# Patient Record
Sex: Female | Born: 1995 | Race: White | Hispanic: No | Marital: Single | Smoking: Current every day smoker
Health system: Southern US, Community
[De-identification: ages and names within clinical notes are randomized; demographics above are authoritative.]

## PROBLEM LIST (undated history)

## (undated) ENCOUNTER — Inpatient Hospital Stay (HOSPITAL_COMMUNITY)
Admission: RE | Payer: Medicaid Other | Source: Ambulatory Visit | Attending: Certified Nurse Midwife | Admitting: Certified Nurse Midwife

## (undated) ENCOUNTER — Emergency Department (HOSPITAL_COMMUNITY): Discharge status: Absent without leave | Payer: Medicaid Other

## (undated) DIAGNOSIS — F419 Anxiety disorder, unspecified: Secondary | ICD-10-CM

## (undated) DIAGNOSIS — F329 Major depressive disorder, single episode, unspecified: Secondary | ICD-10-CM

## (undated) DIAGNOSIS — F4310 Post-traumatic stress disorder, unspecified: Secondary | ICD-10-CM

## (undated) DIAGNOSIS — F32A Depression, unspecified: Secondary | ICD-10-CM

## (undated) HISTORY — DX: Major depressive disorder, single episode, unspecified: F32.9

## (undated) HISTORY — DX: Depression, unspecified: F32.A

## (undated) HISTORY — PX: NO PAST SURGERIES: SHX2092

---
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
## 2016-04-20 IMAGING — CR DG CHEST 1V PORT
1 series · 1 of 1 positions shown · non-contrast
Comparison: None.

CLINICAL DATA: Unresponsive.  Seizure.

EXAM:
PORTABLE CHEST - 1 VIEW

[AP]
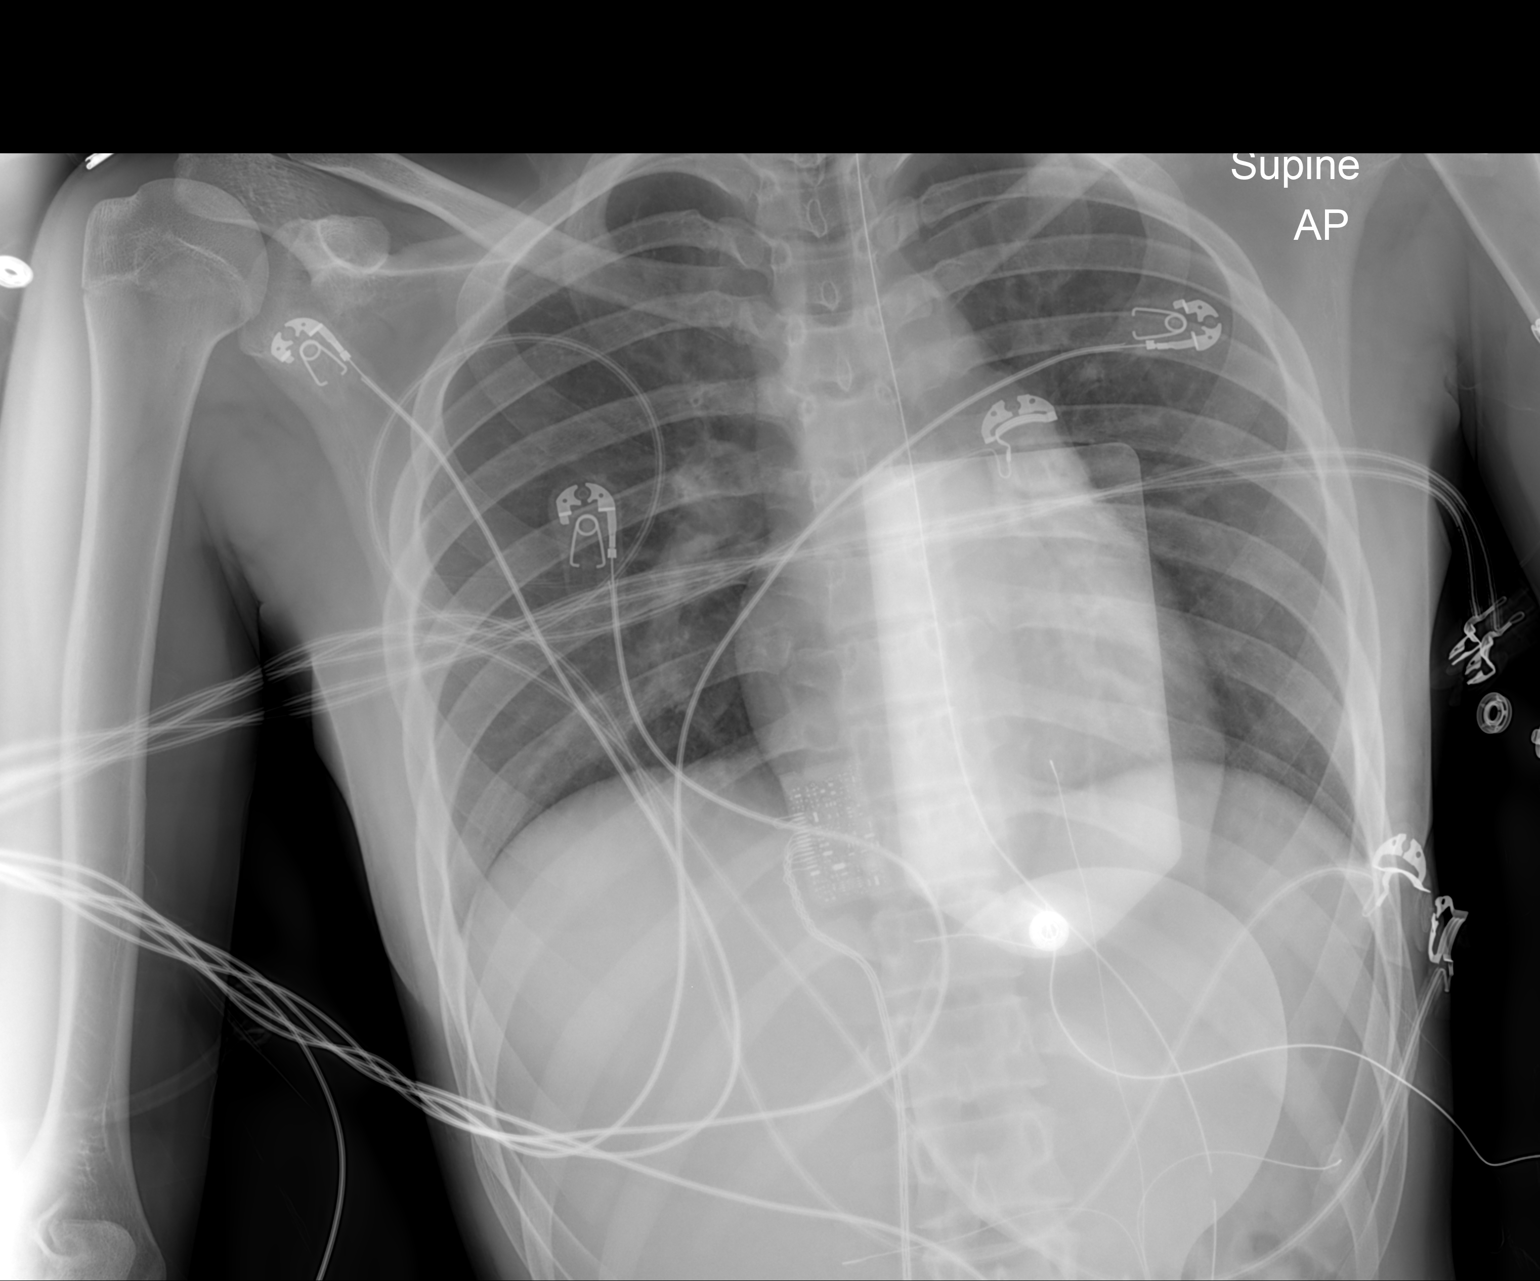

[1 of 1 positions shown; findings below may reference images not displayed]

FINDINGS: Endotracheal tube has tip 6.2 cm above the carina. Lungs are
hypoinflated and otherwise clear. Cardiomediastinal silhouette is
within normal. Bones soft tissues are within normal.
IMPRESSION: No active disease.
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
## 2019-02-26 ENCOUNTER — Encounter (HOSPITAL_COMMUNITY): Payer: Self-pay | Admitting: Emergency Medicine

## 2019-02-26 ENCOUNTER — Inpatient Hospital Stay (HOSPITAL_COMMUNITY)
Admission: EM | Admit: 2019-02-26 | Discharge: 2019-02-26 | Disposition: A | Discharge status: Home / Self Care | Payer: Medicaid Other | Attending: Obstetrics and Gynecology | Admitting: Obstetrics and Gynecology

## 2019-02-26 ENCOUNTER — Other Ambulatory Visit: Payer: Self-pay

## 2019-02-26 ENCOUNTER — Encounter: Payer: Self-pay | Admitting: Student

## 2019-02-26 DIAGNOSIS — O210 Mild hyperemesis gravidarum: Secondary | ICD-10-CM | POA: Diagnosis not present

## 2019-02-26 DIAGNOSIS — Z3A01 Less than 8 weeks gestation of pregnancy: Secondary | ICD-10-CM | POA: Diagnosis not present

## 2019-02-26 DIAGNOSIS — Z3490 Encounter for supervision of normal pregnancy, unspecified, unspecified trimester: Secondary | ICD-10-CM

## 2019-02-26 DIAGNOSIS — O99331 Smoking (tobacco) complicating pregnancy, first trimester: Secondary | ICD-10-CM | POA: Diagnosis not present

## 2019-02-26 DIAGNOSIS — F17210 Nicotine dependence, cigarettes, uncomplicated: Secondary | ICD-10-CM | POA: Insufficient documentation

## 2019-02-26 DIAGNOSIS — F1290 Cannabis use, unspecified, uncomplicated: Secondary | ICD-10-CM | POA: Insufficient documentation

## 2019-02-26 DIAGNOSIS — R109 Unspecified abdominal pain: Secondary | ICD-10-CM | POA: Diagnosis present

## 2019-02-26 HISTORY — DX: Post-traumatic stress disorder, unspecified: F43.10

## 2019-02-26 HISTORY — DX: Anxiety disorder, unspecified: F41.9

## 2019-02-26 LAB — URINALYSIS, ROUTINE W REFLEX MICROSCOPIC
Bilirubin Urine: NEGATIVE
Glucose, UA: NEGATIVE mg/dL
Hgb urine dipstick: NEGATIVE
Ketones, ur: NEGATIVE mg/dL
Leukocytes,Ua: NEGATIVE
Nitrite: NEGATIVE
Protein, ur: NEGATIVE mg/dL
Specific Gravity, Urine: 1.013 (ref 1.005–1.030)
pH: 5 (ref 5.0–8.0)

## 2019-02-26 LAB — CBC
HCT: 42.8 % (ref 36.0–46.0)
Hemoglobin: 14 g/dL (ref 12.0–15.0)
MCH: 29.5 pg (ref 26.0–34.0)
MCHC: 32.7 g/dL (ref 30.0–36.0)
MCV: 90.1 fL (ref 80.0–100.0)
Platelets: 293 10*3/uL (ref 150–400)
RBC: 4.75 MIL/uL (ref 3.87–5.11)
RDW: 12.6 % (ref 11.5–15.5)
WBC: 11.7 10*3/uL — ABNORMAL HIGH (ref 4.0–10.5)
nRBC: 0 % (ref 0.0–0.2)

## 2019-02-26 LAB — I-STAT BETA HCG BLOOD, ED (MC, WL, AP ONLY): I-stat hCG, quantitative: >2000 m[IU]/mL — ABNORMAL HIGH (ref <5)

## 2019-02-26 LAB — COMPREHENSIVE METABOLIC PANEL
ALT: 15 U/L (ref 0–44)
AST: 17 U/L (ref 15–41)
Albumin: 4.2 g/dL (ref 3.5–5.0)
Alkaline Phosphatase: 66 U/L (ref 38–126)
Anion gap: 8 (ref 5–15)
BUN: 17 mg/dL (ref 6–20)
CO2: 25 mmol/L (ref 22–32)
Calcium: 9.3 mg/dL (ref 8.9–10.3)
Chloride: 99 mmol/L (ref 98–111)
Creatinine, Ser: 0.71 mg/dL (ref 0.44–1.00)
GFR calc Af Amer: >60 mL/min (ref >60)
GFR calc non Af Amer: >60 mL/min (ref >60)
Glucose, Bld: 92 mg/dL (ref 70–99)
Potassium: 4 mmol/L (ref 3.5–5.1)
Sodium: 132 mmol/L — ABNORMAL LOW (ref 135–145)
Total Bilirubin: 0.5 mg/dL (ref 0.3–1.2)
Total Protein: 7.5 g/dL (ref 6.5–8.1)

## 2019-02-26 LAB — LIPASE, BLOOD: Lipase: 38 U/L (ref 11–51)

## 2019-02-26 LAB — RAPID URINE DRUG SCREEN, HOSP PERFORMED
Amphetamines: NOT DETECTED
Barbiturates: NOT DETECTED
Benzodiazepines: NOT DETECTED
Cocaine: NOT DETECTED
Opiates: NOT DETECTED
Tetrahydrocannabinol: POSITIVE — AB

## 2019-02-26 MED ORDER — PRENATAL ADULT GUMMY/DHA/FA 0.4-25 MG PO CHEW: 1.0000 | CHEWABLE_TABLET | Freq: Every day | ORAL | 1 refills | Status: AC

## 2019-02-26 MED ORDER — DOCUSATE SODIUM 100 MG PO CAPS: 100.0000 mg | ORAL_CAPSULE | Freq: Two times a day (BID) | ORAL | 0 refills | Status: AC

## 2019-02-26 MED ORDER — ONDANSETRON 4 MG PO TBDP
8.0000 mg | ORAL_TABLET | Freq: Once | ORAL | Status: AC
  Administered 2019-02-26: 8 mg via ORAL
  Filled 2019-02-26: qty 2

## 2019-02-26 MED ORDER — ONDANSETRON 8 MG PO TBDP: 8.0000 mg | ORAL_TABLET | Freq: Three times a day (TID) | ORAL | 0 refills | Status: AC | PRN

## 2019-02-26 MED ORDER — SODIUM CHLORIDE 0.9% FLUSH: 3.0000 mL | Freq: Once | INTRAVENOUS | Status: DC

## 2019-02-26 NOTE — MAU Note (Signed)
Sent up from ED, +preg test in  ED

## 2019-02-26 NOTE — MAU Note (Signed)
Pt states she began having n/v yesterday morning.  Pt states she has vomited twice in last 24hrs.  Pt states she has not taken any meds for n/v.  Denies pain or VB.

## 2019-02-26 NOTE — Discharge Instructions (Signed)
-Please call [facility] at  [phone] to schedule a new ob visit.  -Right now we assume you are about [redacted] weeks pregnant, and we don't really start prenatal care until around [redacted] weeks pregnant.  -Someone will call you for an ultrasound in two weeks.     Morning Sickness  Morning sickness is when a woman feels nauseous during pregnancy. This nauseous feeling may or may not come with vomiting. It often occurs in the morning, but it can be a problem at any time of day. Morning sickness is most common during the first trimester. In some cases, it may continue throughout pregnancy. Although morning sickness is unpleasant, it is usually harmless unless the woman develops severe and continual vomiting (hyperemesis gravidarum), a condition that requires more intense treatment. What are the causes? The exact cause of this condition is not known, but it seems to be related to normal hormonal changes that occur in pregnancy. What increases the risk? You are more likely to develop this condition if:  You experienced nausea or vomiting before your pregnancy.  You had morning sickness during a previous pregnancy.  You are pregnant with more than one baby, such as twins. What are the signs or symptoms? Symptoms of this condition include:  Nausea.  Vomiting. How is this diagnosed? This condition is usually diagnosed based on your signs and symptoms. How is this treated? In many cases, treatment is not needed for this condition. Making some changes to what you eat may help to control symptoms. Your health care provider may also prescribe or recommend:  Vitamin B6 supplements.  Anti-nausea medicines.  Ginger. Follow these instructions at home: Medicines  Take over-the-counter and prescription medicines only as told by your health care provider. Do not use any prescription, over-the-counter, or herbal medicines for morning sickness without first talking with your  health care provider.  Taking multivitamins before getting pregnant can prevent or decrease the severity of morning sickness in most women. Eating and drinking  Eat a piece of dry toast or crackers before getting out of bed in the morning.  Eat 5 or 6 small meals a day.  Eat dry and bland foods, such as rice or a baked potato. Foods that are high in carbohydrates are often helpful.  Avoid greasy, fatty, and spicy foods.  Have someone cook for you if the smell of any food causes nausea and vomiting.  If you feel nauseous after taking prenatal vitamins, take the vitamins at night or with a snack.  Snack on protein foods between meals if you are hungry. Nuts, yogurt, and cheese are good options.  Drink fluids throughout the day.  Try ginger ale made with real ginger, ginger tea made from fresh grated ginger, or ginger candies. General instructions  Do not use any products that contain nicotine or tobacco, such as cigarettes and e-cigarettes. If you need help quitting, ask your health care provider.  Get an air purifier to keep the air in your house free of odors.  Get plenty of fresh air.  Try to avoid odors that trigger your nausea.  Consider trying these methods to help relieve symptoms: ? Wearing an acupressure wristband. These wristbands are often worn for seasickness. ? Acupuncture. Contact a health care provider if:  Your home remedies are not working and you need medicine.  You feel dizzy or light-headed.  You are losing weight. Get help right away if:  You have persistent and uncontrolled nausea and vomiting.  You faint.  You have severe pain in your abdomen. Summary  Morning sickness is when a woman feels nauseous during pregnancy. This nauseous feeling may or may not come with vomiting.  Morning sickness is most common during the first trimester.  It often occurs in the morning, but it can be a problem at any time of day.  In many cases, treatment is  not needed for this condition. Making some changes to what you eat may help to control symptoms. This information is not intended to replace advice given to you by your health care provider. Make sure you discuss any questions you have with your health care provider. Document Released: 10/14/2006 Document Revised: 09/25/2016 Document Reviewed: 09/25/2016 Elsevier Interactive Patient Education  2019 [employer]

## 2019-02-26 NOTE — ED Triage Notes (Signed)
Pt. Stated, Im N/V with stomach pain onset yesterday.
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
## 2019-03-06 ENCOUNTER — Other Ambulatory Visit: Payer: Self-pay | Admitting: Student

## 2019-03-06 DIAGNOSIS — O3680X0 Pregnancy with inconclusive fetal viability, not applicable or unspecified: Secondary | ICD-10-CM

## 2019-03-26 ENCOUNTER — Ambulatory Visit (INDEPENDENT_AMBULATORY_CARE_PROVIDER_SITE_OTHER): Payer: Self-pay

## 2019-03-26 ENCOUNTER — Other Ambulatory Visit: Payer: Self-pay

## 2019-03-26 ENCOUNTER — Encounter: Payer: Self-pay | Admitting: Family Medicine

## 2019-03-26 ENCOUNTER — Ambulatory Visit (HOSPITAL_COMMUNITY)
Admission: RE | Admit: 2019-03-26 | Discharge: 2019-03-26 | Disposition: A | Discharge status: Home / Self Care | Payer: Medicaid Other | Source: Ambulatory Visit | Attending: Student | Admitting: Student

## 2019-03-26 DIAGNOSIS — O3680X0 Pregnancy with inconclusive fetal viability, not applicable or unspecified: Secondary | ICD-10-CM

## 2019-03-26 DIAGNOSIS — Z3491 Encounter for supervision of normal pregnancy, unspecified, first trimester: Secondary | ICD-10-CM

## 2019-03-26 NOTE — Progress Notes (Addendum)
Pt here today for [place] results.  Pt informed that she has a viable pregnancy and that her due date based on first trimester [place] 10/25/19 and that she is 9w 4d today.  Pt denies any pain or bleeding.  Medications reconciled.  [place] office to provide proof of pregnancy letter to start prenatal care.    Mel Almond, RN 03/26/19  Chart reviewed for nurse visit. Agree with plan of care.   Virginia Rochester, NP 03/26/2019 2:33 PM

## 2019-03-30 ENCOUNTER — Telehealth: Payer: Self-pay | Admitting: Family Medicine

## 2019-03-30 NOTE — Telephone Encounter (Signed)
Spoke to patient about her appointment on 7/27 @ 9:30. Patient instructed that this visit will be a phone visit and she does not have to come to the office for this appointment. Patient instructed to be available around her appointment. Patient verbalized understanding.

## 2019-04-02 ENCOUNTER — Other Ambulatory Visit: Payer: Self-pay

## 2019-04-02 ENCOUNTER — Ambulatory Visit: Payer: Medicaid Other | Admitting: General Practice

## 2019-04-02 DIAGNOSIS — Z3A10 10 weeks gestation of pregnancy: Secondary | ICD-10-CM

## 2019-04-02 NOTE — Progress Notes (Signed)
0948- Attempted to call patient for new OB intake appt, no answer. No option to leave VM.  1006- Attempted to call patient for second attempt, no answer. No option to leave VM.   New Ob intake appt will be rescheduled.  Koren Bound RN BSN 04/02/19

## 2019-04-19 ENCOUNTER — Encounter: Payer: Self-pay | Admitting: Family Medicine

## 2019-04-19 ENCOUNTER — Encounter: Payer: Medicaid Other | Admitting: Family Medicine

## 2019-04-19 NOTE — Progress Notes (Signed)
Patient did not keep appointment today. She may call to reschedule.  

## 2019-04-26 ENCOUNTER — Telehealth: Payer: Self-pay | Admitting: Medical

## 2019-04-26 NOTE — Telephone Encounter (Signed)
Attempted to reach patient about changes made to her appointment. Left a voicemail message for her to call us back for detailed information.

## 2019-05-02 ENCOUNTER — Encounter: Payer: Medicaid Other | Admitting: Medical

## 2019-05-15 ENCOUNTER — Encounter: Payer: Medicaid Other | Admitting: Student

## 2019-05-29 ENCOUNTER — Encounter: Payer: Medicaid Other | Admitting: Nurse Practitioner

## 2019-06-04 ENCOUNTER — Encounter: Payer: Medicaid Other | Admitting: Nurse Practitioner

## 2019-06-11 ENCOUNTER — Other Ambulatory Visit (HOSPITAL_COMMUNITY)
Admission: RE | Admit: 2019-06-11 | Discharge: 2019-06-11 | Disposition: A | Discharge status: Home / Self Care | Payer: Medicaid Other | Source: Ambulatory Visit | Attending: Certified Nurse Midwife | Admitting: Certified Nurse Midwife

## 2019-06-11 ENCOUNTER — Ambulatory Visit (INDEPENDENT_AMBULATORY_CARE_PROVIDER_SITE_OTHER): Payer: Medicaid Other | Admitting: Certified Nurse Midwife

## 2019-06-11 ENCOUNTER — Other Ambulatory Visit: Payer: Self-pay

## 2019-06-11 ENCOUNTER — Encounter: Payer: Self-pay | Admitting: Certified Nurse Midwife

## 2019-06-11 VITALS — BP 130/83 | HR 108 | Temp 98.2°F | Wt 136.6 lb

## 2019-06-11 DIAGNOSIS — O26892 Other specified pregnancy related conditions, second trimester: Secondary | ICD-10-CM

## 2019-06-11 DIAGNOSIS — N898 Other specified noninflammatory disorders of vagina: Secondary | ICD-10-CM

## 2019-06-11 DIAGNOSIS — Z3A20 20 weeks gestation of pregnancy: Secondary | ICD-10-CM | POA: Diagnosis not present

## 2019-06-11 DIAGNOSIS — O0932 Supervision of pregnancy with insufficient antenatal care, second trimester: Secondary | ICD-10-CM

## 2019-06-11 DIAGNOSIS — Z3482 Encounter for supervision of other normal pregnancy, second trimester: Secondary | ICD-10-CM

## 2019-06-11 DIAGNOSIS — Z3480 Encounter for supervision of other normal pregnancy, unspecified trimester: Secondary | ICD-10-CM | POA: Diagnosis present

## 2019-06-11 DIAGNOSIS — F1290 Cannabis use, unspecified, uncomplicated: Secondary | ICD-10-CM

## 2019-06-11 MED ORDER — METRONIDAZOLE 500 MG PO TABS: 500.0000 mg | ORAL_TABLET | Freq: Two times a day (BID) | ORAL | 0 refills | Status: DC

## 2019-06-11 MED ORDER — BLOOD PRESSURE MONITORING DEVI: 1.0000 | 0 refills | Status: DC

## 2019-06-11 NOTE — Progress Notes (Signed)
Pt states is having some thick d/c with odor
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
## 2019-06-11 NOTE — Patient Instructions (Addendum)
Marijuana Use During Pregnancy and Breastfeeding  Marijuana is the dried leaves, flowers, and stems of the Cannabis sativa or Cannabis indica plant. The plant's active ingredients (cannabinoids), including a chemical called THC, change the chemistry of the brain. Marijuana smoke also has many of the same chemicals as cigarette smoke that cause breathing problems. Marijuana gets into your blood through your lungs when you smoke it and through your digestive system when you swallow it. Using marijuana in any form may be harmful for you and your baby when you are trying to become pregnant and during pregnancy. This includes marijuana that is prescribed to you by a health care provider (medical marijuana). Once marijuana is in your blood, it can travel through your placenta to your baby. It may also pass through breast milk. How does this affect me? Marijuana affects you both mentally and physically. Using marijuana can make you feel high and relaxed. It can also have negative effects, especially at high doses or with long-term use. These include:  Rapid heartbeat and stress on your heart.  Lung irritation and breathing problems.  Difficulty thinking and making decisions.  Seeing or believing things that are not true (hallucinations and paranoia).  Mood swings, depression, or anxiety.  Decreased ability to learn and remember.  Difficulty getting pregnant. Marijuana can also affect your pregnancy. Not all the effects are known. However, if you use marijuana during pregnancy, you may:  Be less likely to get regular prenatal care and do the things that you need to do to have a healthy pregnancy.  Be more likely to use other drugs that can harm your pregnancy, like drinking alcohol and smoking cigarettes.  Be at higher risk of having your baby die after 28 weeks of pregnancy (stillbirth).  Be at higher risk of giving birth before 37 weeks of pregnancy (premature birth). How does this affect my  baby? If you use marijuana during pregnancy, this may affect your baby's development, birth, and life after birth. Your baby may:  Be born prematurely, which can cause physical and mental problems.  Be born with a low birth weight, which can lead to physical and mental problems.  Have problems with brain development.  Have difficulty growing.  Have attention and behavior problems later in life.  Do poorly at school and have learning problems later in life.  Have problems with vision and coordination.  Be at higher risk for using marijuana by age 38. More research is needed to find out exactly how marijuana affects a baby during breastfeeding. Some studies suggest that the chemicals in marijuana can be passed to a baby through breast milk. To limit possible risks, you should not use marijuana during breastfeeding. Follow these instructions at home:  Let your health care provider know if you use marijuana before trying to get pregnant, during pregnancy, or during breastfeeding.  Do not use marijuana in any form when you are trying to get pregnant, when you are pregnant, or when you are breastfeeding. If you are having trouble stopping marijuana use, ask your health care provider for help.  Do not smoke. If you need help quitting, ask your health care provider for help.  If you are using medical marijuana, ask your health care provider to switch you to a medicine that is safer to use during pregnancy or breastfeeding.  Keep all your prenatal visits as told by your health care provider. This is important. Where to find more information [employer] on Drug Abuse: www.drugabuse.gov March of Dimes:  www.marchofdimes.org/pregnancy Contact a health care provider if:  You use marijuana and want to get pregnant.  You use marijuana during pregnancy or breastfeeding.  You need help stopping marijuana use. Get help right away if:  Your baby is not gaining weight or growing as  expected. Summary  Using marijuana in any form may be harmful for you and your baby when you are trying to become pregnant, during pregnancy, and during breastfeeding. This includes marijuana that is prescribed to you (medical marijuana).  Some studies suggest that marijuana may pass through breast milk and can affect your baby's brain development.  Talk to your health care provider if you use marijuana in any form while trying to get pregnant, during pregnancy, or while breastfeeding.  Ask your health care provider for help if you are not able to stop using marijuana. This information is not intended to replace advice given to you by your health care provider. Make sure you discuss any questions you have with your health care provider. Document Released: 05/11/2017 Document Revised: 12/15/2018 Document Reviewed: 05/11/2017 Elsevier Patient Education  2020 [employer].   Contraception Choices Contraception, also called birth control, means things to use or ways to try not to get pregnant. Hormonal birth control This kind of birth control uses hormones. Here are some types of hormonal birth control:  A tube that is put under skin of the arm (implant). The tube can stay in for as long as 3 years.  Shots to get every 3 months (injections).  Pills to take every day (birth control pills).  A patch to change 1 time each week for 3 weeks (birth control patch). After that, the patch is taken off for 1 week.  A ring to put in the vagina. The ring is left in for 3 weeks. Then it is taken out of the vagina for 1 week. Then a new ring is put in.  Pills to take after unprotected sex (emergency birth control pills). Barrier birth control Here are some types of barrier birth control:  A thin covering that is put on the penis before sex (female condom). The covering is thrown away after sex.  A soft, loose covering that is put in the vagina before sex (female condom). The covering is thrown away  after sex.  A rubber bowl that sits over the cervix (diaphragm). The bowl must be made for you. The bowl is put into the vagina before sex. The bowl is left in for 6-8 hours after sex. It is taken out within 24 hours.  A small, soft cup that fits over the cervix (cervical cap). The cup must be made for you. The cup can be left in for 6-8 hours after sex. It is taken out within 48 hours.  A sponge that is put into the vagina before sex. It must be left in for at least 6 hours after sex. It must be taken out within 30 hours. Then it is thrown away.  A chemical that kills or stops sperm from getting into the uterus (spermicide). It may be a pill, cream, jelly, or foam to put in the vagina. The chemical should be used at least 10-15 minutes before sex. IUD (intrauterine) birth control An IUD is a small, T-shaped piece of plastic. It is put inside the uterus. There are two kinds:  Hormone IUD. This kind can stay in for 3-5 years.  Copper IUD. This kind can stay in for 10 years. Permanent birth control Here are some types  of permanent birth control:  Surgery to block the fallopian tubes.  Having an insert put into each fallopian tube.  Surgery to tie off the tubes that carry sperm (vasectomy). Natural planning birth control Here are some types of natural planning birth control:  Not having sex on the days the woman could get pregnant.  Using a calendar: ? To keep track of the length of each period. ? To find out what days pregnancy can happen. ? To plan to not have sex on days when pregnancy can happen.  Watching for symptoms of ovulation and not having sex during ovulation. One way the woman can check for ovulation is to check her temperature.  Waiting to have sex until after ovulation. Summary  Contraception, also called birth control, means things to use or ways to try not to get pregnant.  Hormonal methods of birth control include implants, injections, pills, patches, vaginal  rings, and emergency birth control pills.  Barrier methods of birth control can include female condoms, female condoms, diaphragms, cervical caps, sponges, and spermicides.  There are two types of IUD (intrauterine device) birth control. An IUD can be put in a woman's uterus to prevent pregnancy for 3-5 years.  Permanent sterilization can be done through a procedure for males, females, or both.  Natural planning methods involve not having sex on the days when the woman could get pregnant. This information is not intended to replace advice given to you by your health care provider. Make sure you discuss any questions you have with your health care provider. Document Released: 06/20/2009 Document Revised: 12/13/2018 Document Reviewed: 09/02/2016 Elsevier Patient Education  2020 [employer].

## 2019-06-11 NOTE — Addendum Note (Signed)
Addended by: Bethanne Ginger on: 06/11/2019 11:19 AM   Modules accepted: Orders

## 2019-06-11 NOTE — Progress Notes (Signed)
Medicaid Home Form Completed-06/11/2019

## 2019-06-12 ENCOUNTER — Telehealth: Payer: Medicaid Other | Admitting: Student

## 2019-06-13 LAB — URINE CULTURE, OB REFLEX

## 2019-06-13 LAB — CULTURE, OB URINE

## 2019-06-14 ENCOUNTER — Encounter: Payer: Self-pay | Admitting: *Deleted

## 2019-06-15 ENCOUNTER — Inpatient Hospital Stay (HOSPITAL_COMMUNITY)
Admission: RE | Admit: 2019-06-15 | Discharge: 2019-06-15 | Disposition: A | Discharge status: Home / Self Care | Payer: Medicaid Other | Source: Ambulatory Visit | Attending: Certified Nurse Midwife | Admitting: Certified Nurse Midwife

## 2019-06-15 LAB — OBSTETRIC PANEL, INCLUDING HIV
Basophils Absolute: 0 10*3/uL (ref 0.0–0.2)
Basos: 0 %
EOS (ABSOLUTE): 0.1 10*3/uL (ref 0.0–0.4)
Eos: 1 %
HIV Screen 4th Generation wRfx: NONREACTIVE
Hematocrit: 35.5 % (ref 34.0–46.6)
Hemoglobin: 11.8 g/dL (ref 11.1–15.9)
Hepatitis B Surface Ag: NEGATIVE
Immature Grans (Abs): 0.2 10*3/uL — ABNORMAL HIGH (ref 0.0–0.1)
Immature Granulocytes: 1 %
Lymphocytes Absolute: 2.8 10*3/uL (ref 0.7–3.1)
Lymphs: 19 %
MCH: 30.3 pg (ref 26.6–33.0)
MCHC: 33.2 g/dL (ref 31.5–35.7)
MCV: 91 fL (ref 79–97)
Monocytes Absolute: 0.6 10*3/uL (ref 0.1–0.9)
Monocytes: 4 %
Neutrophils Absolute: 11 10*3/uL — ABNORMAL HIGH (ref 1.4–7.0)
Neutrophils: 75 %
Platelets: 254 10*3/uL (ref 150–450)
RBC: 3.89 x10E6/uL (ref 3.77–5.28)
RDW: 12.2 % (ref 11.7–15.4)
RPR Ser Ql: NONREACTIVE
Rh Factor: POSITIVE
Rubella Antibodies, IGG: 1.35 index (ref >0.99)
WBC: 14.8 10*3/uL — ABNORMAL HIGH (ref 3.4–10.8)

## 2019-06-15 LAB — AFP, SERUM, OPEN SPINA BIFIDA
AFP MoM: 1.22
AFP Value: 77.8 ng/mL
Gest. Age on Collection Date: 20.4 weeks
Maternal Age At EDD: 22.2 yr
OSBR Risk 1 IN: 6110
Test Results:: NEGATIVE
Weight: 136 [lb_av]

## 2019-06-15 LAB — CYTOLOGY - PAP
Chlamydia: NEGATIVE
Diagnosis: NEGATIVE
Neisseria Gonorrhea: NEGATIVE
Trichomonas: POSITIVE — AB

## 2019-06-15 LAB — AB SCR+ANTIBODY ID: Antibody Screen: POSITIVE — AB

## 2019-06-16 LAB — URINE DRUGS OF ABUSE SCREEN W ALC, ROUTINE (REF LAB)
Barbiturate Quant, Ur: NEGATIVE ng/mL
Benzodiazepine Quant, Ur: NEGATIVE ng/mL
Cannabinoid Quant, Ur: NEGATIVE ng/mL
Cocaine (Metab.): NEGATIVE ng/mL
Ethanol, Urine: NEGATIVE %
Methadone Screen, Urine: NEGATIVE ng/mL
Opiate Quant, Ur: NEGATIVE ng/mL
PCP Quant, Ur: NEGATIVE ng/mL
Propoxyphene: NEGATIVE ng/mL

## 2019-06-16 LAB — AMPHETAMINE CONF, UR
Amphetamine GC/MS Conf: 1416 ng/mL
Amphetamine: POSITIVE — AB
Amphetamines: POSITIVE — AB
Methamphetamine Quant, Ur: 2502 ng/mL
Methamphetamine: POSITIVE — AB

## 2019-06-18 LAB — CERVICOVAGINAL ANCILLARY ONLY
Bacterial Vaginitis (gardnerella): POSITIVE — AB
Candida Glabrata: NEGATIVE
Candida Vaginitis: POSITIVE — AB

## 2019-06-21 ENCOUNTER — Telehealth: Payer: Self-pay

## 2019-06-21 ENCOUNTER — Other Ambulatory Visit: Payer: Self-pay

## 2019-06-21 ENCOUNTER — Telehealth: Payer: Self-pay | Admitting: Emergency Medicine

## 2019-06-21 DIAGNOSIS — N898 Other specified noninflammatory disorders of vagina: Secondary | ICD-10-CM

## 2019-06-21 MED ORDER — TERCONAZOLE 0.4 % VA CREA: 1.0000 | TOPICAL_CREAM | Freq: Every day | VAGINAL | 0 refills | Status: AC

## 2019-06-21 MED ORDER — METRONIDAZOLE 500 MG PO TABS: 500.0000 mg | ORAL_TABLET | Freq: Two times a day (BID) | ORAL | 0 refills | Status: DC

## 2019-06-21 NOTE — Telephone Encounter (Signed)
Called pt to make her aware of test results,advised that she tested positive for BV & Yeast & her Rx was sent to her pharmacy/ Pt verbalized understanding.

## 2019-06-21 NOTE — Telephone Encounter (Signed)
Called pt to advise that on Pap Smear it shows that she tested + for Trich and that Flagyl was sent to her pharmacy.Pt states that this was not a good time for her to talk & asked if I could call her back tomorrow.

## 2019-06-25 ENCOUNTER — Encounter: Payer: Self-pay | Admitting: *Deleted

## 2019-06-25 ENCOUNTER — Telehealth: Payer: Self-pay

## 2019-06-25 NOTE — Telephone Encounter (Signed)
Called pt to go over [place] that was not completed on Thursday regarding pt testing + for Trich,Left message for Pt to pick up Rx & to call for details.Also will send My Chart message.

## 2019-07-03 ENCOUNTER — Ambulatory Visit (HOSPITAL_COMMUNITY): Payer: Medicaid Other | Attending: Obstetrics and Gynecology

## 2019-07-09 ENCOUNTER — Other Ambulatory Visit: Payer: Self-pay

## 2019-07-09 ENCOUNTER — Telehealth (INDEPENDENT_AMBULATORY_CARE_PROVIDER_SITE_OTHER): Payer: Medicaid Other | Admitting: Student

## 2019-07-09 DIAGNOSIS — Z3A24 24 weeks gestation of pregnancy: Secondary | ICD-10-CM | POA: Diagnosis not present

## 2019-07-09 DIAGNOSIS — O99322 Drug use complicating pregnancy, second trimester: Secondary | ICD-10-CM | POA: Diagnosis not present

## 2019-07-09 DIAGNOSIS — O23592 Infection of other part of genital tract in pregnancy, second trimester: Secondary | ICD-10-CM

## 2019-07-09 DIAGNOSIS — A5901 Trichomonal vulvovaginitis: Secondary | ICD-10-CM

## 2019-07-09 DIAGNOSIS — Z3480 Encounter for supervision of other normal pregnancy, unspecified trimester: Secondary | ICD-10-CM

## 2019-07-09 MED ORDER — METRONIDAZOLE 500 MG PO TABS: 2000.0000 mg | ORAL_TABLET | Freq: Once | ORAL | 0 refills | Status: AC

## 2019-07-09 NOTE — Progress Notes (Signed)
   TELEHEALTH VIRTUAL OBSTETRICS VISIT ENCOUNTER NOTE  I connected with Deborah Pennington on 07/09/19 at 10:55 AM EST by telephone at home and verified that I am speaking with the correct person using two identifiers.   I discussed the limitations, risks, security and privacy concerns of performing an evaluation and management service by telephone and the availability of in person appointments. I also discussed with the patient that there may be a patient responsible charge related to this service. The patient expressed understanding and agreed to proceed.  Subjective:  Deborah Pennington is a 23 y.o. G1P0 at [redacted]w[redacted]d being followed for ongoing prenatal care.  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  Patient reports no complaints. Reports fetal movement. Denies any contractions, bleeding or leaking of fluid.   Reviewed positive for trichomonas. States she didn't get the message last month so never took the treatment.   Reviewed positive drug screen. Patient states the last time she used methamphetamines was around the time of her last drug screen. States no use since then. Reviewed that her most recent screen was negative for marijuana.   The following portions of the patient's history were reviewed and updated as appropriate: allergies, current medications, past family history, past medical history, past social history, past surgical history and problem list.   Objective:   General:  Alert, oriented and cooperative.   Mental Status: Normal mood and affect perceived. Normal judgment and thought content.  Rest of physical exam deferred due to type of encounter  Assessment and Plan:  Pregnancy:  G1P0 at 108w4d 1. Supervision of other normal pregnancy, antepartum -has anatomy scan scheduled later this week -will bring back in a few weeks for 28 wk labs  2. 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 Will recheck UDS at next visit -Will schedule virtual appt with Jamie  3. Trichomonal vaginitis during pregnancy in second trimester -did not receive treatment last month. Will resend prescription. No intercourse x 1 week. Partner needs to be treated.   Preterm labor symptoms and general obstetric precautions including but not limited to vaginal bleeding, contractions, leaking of fluid and fetal movement were reviewed in detail with the patient.  I discussed the assessment and treatment plan with the patient. The patient was provided an opportunity to ask questions and all were answered. The patient agreed with the plan and demonstrated an understanding of the instructions. The patient was advised to call back or seek an in-person office evaluation/go to [facility] at [facility] for any urgent or concerning symptoms. Please refer to After Visit Summary for other counseling recommendations.   I provided 9 minutes of non-face-to-face time during this encounter.  Return in about 3 weeks (around 07/30/2019) for Routine OB & 28 wk labs .  Future Appointments  Date Time Provider [place]  07/13/2019  1:30 PM [facility] [place] 1 [facility]-M[facility]US M[facility]-US    Shishir Krantz, [place] for [employer], [place]

## 2019-07-09 NOTE — Patient Instructions (Signed)
AREA PEDIATRIC/FAMILY PRACTICE PHYSICIANS  Central/[place]outheast [place] ([id number]) . [facility] Melodie Bouillon, MD; Lum Babe, MD; Sheffield Slider, MD; Leveda Anna, MD; McDiarmid, MD; Jerene Bears, MD; Jennette Kettle, MD; Gwendolyn Grant, MD o [address] [place], [place] [address] o [phone] o Mon-Fri 8:30-12:30, 1:30-5:00 o Providers come to see babies at [facility] o Accepting Medicaid . [facility] [place] o Limited providers who accept newborns: Docia Chuck, MD; Kateri Plummer, MD; Paulino Rily, MD o [address], [place], [place] [address] o [phone] o Mon-Fri 8:00-5:30 o Babies seen by providers at [facility] o Does NOT accept Medicaid o Please call early in hospitalization for appointment (limited availability)  . [facility] Fatima Sanger, MD o [address] [place], [place] [address] o [phone] o Mon, Tue, Thur, Fri 8:30-5:00, Wed 10:00-7:00 (closed 1-2pm) o Babies seen by [facility] providers o Accepting Medicaid . Donnie Coffin - Pediatrician Fae Pippin, MD o [address], [place], [place] [address] o [phone] o Mon-Fri 8:30-5:00, Sat 8:30-12:00 o Provider comes to see babies at [facility] o Accepting Medicaid o Must have been referred from current patients or contacted office prior to delivery . [facility]) Leotis Pain, MD; Ave Filter, MD; Luna Fuse, MD; Kennedy Bucker, MD; Konrad Dolores, MD; Kathlene November, MD; Jenne Campus, MD; Lubertha South, MD; Wynetta Emery, MD; Duffy Rhody, MD; Gerre Couch, NP; Shirl Harris, NP o [address] [place], [place], [place] [address] o [phone] o Mon, Tue, Thur, Fri 8:30-5:30, Wed 9:30-5:30, Sat 8:30-12:30 o Babies seen by [facility] providers o Accepting Medicaid o Only accepting infants of first-time parents or siblings of current patients [facility] discharge coordinator will make follow-up appointment . Cyril Mourning o [address], [place], [place]  [address] o [phone]   Fax - [phone] . [facility] o [address], [place], [place]  [address] o Phone - [phone]   Fax - [phone] . Lucio Edward o [address], [place], [place]  [address] o [phone]  [place] [phone]) . [facility] Jorge Mandril, MD; Alita Chyle, MD; Princella Ion, MD; MD; Earlene Plater, MD; Jamesetta Orleans, MD; Alvera Novel, MD; Clarene Duke, MD; Rana Snare, MD; Carmon Ginsberg, MD; Alinda Money, MD; Hosie Poisson, MD; Mayford Knife, MD o [address] [place], [place] [address] o [phone] o Mon-Fri 8:30-5:00 (extended evenings Mon-Thur as needed), Sat-Sun 10:00-1:00 o Providers come to see babies at [facility] o Accepting Medicaid for families of first-time babies and families with all children in the household age 79 and under. Must register with office prior to making appointment (M-F only). [facility] Odella Aquas, NP; Lynelle Doctor, MD; Susann Givens, MD; [place], [place] o [address], [place], [place] [address] o [phone] o Mon-Fri 8:00-5:00 o Babies seen by providers at [facility] o Does NOT accept Medicaid/Commercial Insurance Only . [facility] [place] [facility]  Suzette Battiest, MD; Zachery Dauer, MD; Stefan Church, MD; Sabino Dick, MD; Quitman Livings, MD; Farris Has, MD; Gaynell Face, MD; Betha Loa, MD; Colon Flattery, MD; Clifton James, MD o [address] [place] [place], [place] [address] o [phone] o Mon-Fri 8:30-5:30, Sat (Oct.-Mar.) 9:00-1:00 o Babies seen by providers at [facility] o Accepting [facility] [phone]) . [facility] Dimitri, MD; Sheliah Hatch, MD o [address], [place], [place] [address] o [phone] o Mon-Fri 8:30-5:00, Sat 8:30-12:00 o Providers come to see babies at [facility] o Does NOT accept Medicaid . [facility] Ricci Barker, PA; Mannie Stabile, MD; [place], [place]; Nancy Fetter, MD; Moreen Fowler, [place],  [place], [place] [address] o [phone] o Mon-Fri 8:00-5:00 o Babies seen by providers at [facility] o Does NOT accept Medicaid o Only accepting babies of parents who are patients o Please call early in hospitalization for appointment (limited availability) . [facility] Pediatricians Blanca Friend, MD; Sharlene Motts, MD; Rod Can, MD; Warner Mccreedy, NP; Sabra Heck, MD; Ermalinda Memos, MD; Sharlett Iles, NP; Aurther Loft, MD; Jerrye Beavers, MD; Marcello Moores, MD; Berline Lopes, MD; Charolette Forward, MD o [place]. [place], [place], [place] [address] o [phone] o Mon-Fri 8:00-5:00, Sat 9:00-12:00 o Providers come to see babies at [facility] o Does NOT accept [facility] [phone]) . [facility] [place] providers accepting new patients: Dayna Ramus, NP; [place], [place], [place], [place] [address] o [phone] o Mon-Fri 8:00-5:00 o Babies seen by providers at [facility] o Does NOT accept Medicaid o Only accepting babies of parents who are patients o Please call early in hospitalization for appointment (limited availability) . [facility] Oswaldo Conroy, MD; Sheran Lawless, MD o [address] [place], [place] [address] o [phone] (press 1 to schedule appointment) o Mon-Fri 8:00-5:00 o Providers come to see babies at [facility] o Does NOT accept Medicaid . [facility] Jodi Mourning, MD o [address] [place], [place] [address] o [phone] o Mon-Fri 8:30-5:00 (lunch 12:30-1:00), extended hours by appointment only Wed 5:00-6:30 o Babies seen by [facility] providers o Accepting Medicaid . [place] at Evalyn Casco, MD; [place], MD; Ethlyn Gallery, MD o [place], [place], [place] [address] o [phone] o Mon-Fri 8:00-5:00 o Babies seen by [facility] providers o Does NOT accept Medicaid . Therapist, music at [place], MD; Yong Channel, MD; [place], [place] [address] [place], [place] [address] o  [phone] o Mon-Fri 8:00-5:00 o Babies seen by [facility] providers o Does NOT accept Medicaid . [place], [place]; [place], [place]; [place], NP; Albertina Parr, MD; Frederic Jericho, MD; Ronney Lion, MD; Carlos Levering, NP; Jerelene Redden, NP; Tomasita Crumble, NP; Ronelle Nigh, NP; Corinna Lines, MD; [place], MD o [address] [place], [place] [address] o [phone] o Mon-Fri 8:30-5:00, Sat 10:00-1:00 o Providers come to see babies at [facility] o Does NOT accept Medicaid o Free prenatal information session Tuesdays at 4:45pm . [facility] Porfirio Oar, MD; [place], [place]; [place], [place]; [place], [place]., [place] [address] o [phone] o Mon-Fri 7:30-5:30 o Babies seen by [id number] providers . [facility] Children's Doctor o [address], [place], [place], [place]  [address] o [phone]   Fax - [phone]  [place] [phone] & [phone]) . [place] o [address] [place], [place] [address] o [phone] o Mon-Thur 8:00-6:00 o Providers come to see babies at [place] Medicaid . [place], NP; Melford Aase, MD; [place], [place]; [place], [address] [place], [place] [address] o [phone] o Mon-Thur 7:30-7:30, Fri 7:30-4:30 o Babies seen by [facility] providers o Accepting Medicaid . [facility] Nyra Jabs, MD; Cristino Martes, NP; Gertie Baron, MD o [place] [address], [place], [place] [address] o [phone] o Mon-Fri 8:30-5:00, Sat 8:30-12:00 o Providers come to see babies at [place] Medicaid o Must have "Meet & Greet" appointment at office prior to delivery . [place] ([place]) o Hubbard,  MD; Juleen China, MD; Clydene Laming, [place] [address], [place], [place] [address] o [phone] o Mon-Wed 8:00-6:00, Thur-Fri 8:00-5:00, Sat 9:00-12:00 o Providers come to see  babies at [facility] o Does NOT accept Medicaid o Only accepting siblings of current patients . [facility] [place], [place], [place], [place]  [address] o [phone]   Fax [phone] . [place] [address], [place], [place]  [address] o [phone]   Fax - Artois Craig [phone] & [phone]) . Therapist, music at [place], DO; [place], [address] [place], [place] [address] o [phone] o Mon-Fri 7:00-5:00 o Babies seen by [facility] providers o Does NOT accept Medicaid . [place], MD; [place], [place]; [place], [place], [place], [place] [address] o [phone] o Mon-Fri 8:00-5:00 o Babies seen by [facility] providers o Accepting Medicaid . Reed, MD; [place], [place]; [place], NP; [place], [place], [place], [place] [address] o [phone] o Mon-Fri 8:00-5:00 o Babies seen by providers at [place] [phone]) . [facility] [place], [place] o [address] [place], [place] [address] o [phone] o Mon-Fri 8:00-5:00 o Babies seen by [facility] providers o Does NOT accept Medicaid o Limited availability, please call early in hospitalization to schedule follow-up . [facility] Leilani Merl, PA; Maisie Fus, MD; [place], MD; [place], [place]; Jeannine Kitten, MD; [place], [place] [facility] [address], [place], [place] [address] o [phone] o Mon-Fri 8:30-5:00, Sat 9:00-12:00 o Babies seen by providers at [facility] o Accepting Medicaid o Please register online then schedule online or call office o [email or web address] . [place] [facility] [place]) Kristian Covey, NP; Dwyane Dee, MD; Leonidas Romberg, PA o [address] [place], [place], [place] [address] o [phone] o Mon-Fri 8:00-5:00 o Babies seen by providers at [facility] o Accepting Medicaid . [place] ([facility]) Dairl Ponder, MD; Rayvon Char, NP; Melina Modena, MD o [address] [place], [place], [place] [address] o [phone] o Mon-Fri 8:00-5:30, Sat&Sun by appointment (phones open at 8:30) o Babies seen by [facility] providers o Accepting Medicaid o Must be a first-time baby or sibling of current patient . [place], [address], [place], [place]  [address] o [phone]   Fax - [phone]  [place] [phone] & [phone]) . [place], [place]; [place], [place]; Benjamine Mola, MD; [place], [place]; Harrell Lark, MD o [address], [place], [place] [address] o [phone] o Mon-Thur 8:00-7:00, Fri 8:00-5:00, Sat 8:00-12:00, Sun 9:00-12:00 o Babies seen by [facility] providers o Accepting Medicaid . [facility], MD; Ruthann Cancer, MD; [facility], MD o [address], [place],  [address] o [phone] o Mon-Thur 8:00-5:00 o Babies seen by providers at [facility] o Accepting Medicaid . [facility] at [place], MD; Coe-Goins, MD; Amedeo Plenty, MD; Bobby Rumpf, MD; List, MD; Lavonia Drafts, MD; Ruthann Cancer, MD; Selinda Eon, MD; Audie Box, MD; Jim Like, MD; Christie [place], MD; Hubbard Hart[place]orn, MD; [place]ena Nunnery, MD o [address] [place], [place]  [address] o [phone] o Mon-Fri 8:00-5:30, Sat (Oct.-Mar.) 9:00-1:00 o Babies seen by providers at [facility] o Accepting Medicaid o Must fill out new patient packet, available online at [email or web address] . [place] ([facility]) Barnabas Lister, NP; [place] Kingfisher, NP; Claiborne Billings, NP; Rolla Plate, MD; [place], [place];  [place] Rhine, MD; Tyron Russell, MD; Delia Chimes, NP o [address], [place], [place] [address] o [phone] o Mon-Thur 8:00-5:30, Fri 8:00-5:00 o Babies seen by providers at [place] [phone]) . [place], [place]; [place], MD; Dennard Schaumann, MD; [place], [place] o [address] [place], [place] [address] o [phone] o Mon-Fri 8:00-5:00 o Babies seen by providers at [place] [phone]) . [place] at [place], [place]; Olen Pel, MD; [place], [place], [place], [place] [address] o [phone] o Mon-Fri 8:00-5:00 o Babies seen by providers at [facility] o Does NOT accept Medicaid o Limited appointment availability, please call early in hospitalization  . Therapist, music at [place], [place]; [place], [address], [place], [place] [address] o [phone] o Mon-Fri 8:00-5:00 o Babies seen by [facility] providers o Does NOT accept Medicaid . [facility] Su Grand, MD; Guy Sandifer, MD; [place], [place]; [place], [place] [address], [place], [place] [address] o [phone] o Mon-Fri 8:00-5:00 o After hours clinic [facility] [place], [place] [address]) [phone] Mon-Fri 5:00-8:00, Sat 12:00-6:00, Sun 10:00-4:00 o Babies seen by [facility] providers o Accepting Medicaid . [place] at [facility] o [address], [place], [place]  [address] o [phone]   Fax - [phone]  [place] [phone]) . Therapist, music at [facility], MD o [address] [place], [place], [place] [address] o [phone] o Mon-Fri 8:00-5:00 o Babies seen by [facility] providers o Does NOT accept Medicaid . [place] ([place] [place]) Bing Neighbors, MD o [address] [place], [place], [place] [address] o [phone] o  Mon-Thur 8:00-7:00, Fri 8:00-5:00, Sat 8:00-12:00 o Babies seen by providers at [facility] o Accepting Medicaid - but does not have vaccinations in office (must be received elsewhere) o Limited availability, please call early in hospitalization  [place] ([address]) . [place], MD o [address] [place] [address] o [phone]  Fax [phone]    Trichomoniasis Trichomoniasis is an STI (sexually transmitted infection) that can affect both women and men. In women, the outer area of the female genitalia (vulva) and the vagina are affected. In men, mainly the penis is affected, but the prostate and other reproductive organs can also be involved.  This condition can be treated with medicine. It often has no symptoms (is asymptomatic), especially in men. If not treated, trichomoniasis can last for months or years. What are the causes? This condition is caused by a parasite called Trichomonas vaginalis. Trichomoniasis most often spreads from person to person (is contagious) through sexual contact. What increases the risk? The following factors may make you more likely to develop this condition:  Having unprotected sex.  Having sex with a partner who has trichomoniasis.  Having multiple sexual partners.  Having had previous trichomoniasis infections or other STIs. What are the signs or symptoms? In women, symptoms of trichomoniasis include:  Abnormal  vaginal discharge that is clear, [place], gray, or yellow-green and foamy and has an unusual "fishy" odor.  Itching and irritation of the vagina and vulva.  Burning or pain during urination or sex.  Redness and swelling of the genitals. In men, symptoms of trichomoniasis include:  Penile discharge that may be foamy or contain pus.  Pain in the penis. This may happen only when urinating.  Itching or irritation inside the penis.  Burning after urination or ejaculation. How is this  diagnosed? In women, this condition may be found during a routine Pap test or physical exam. It may be found in men during a routine physical exam. Your health care provider may do tests to help diagnose this infection, such as:  Urine tests (men and women).  The following in women: ? Testing the pH of the vagina. ? A vaginal swab test that checks for the Trichomonas vaginalis parasite. ? Testing vaginal secretions. Your health care provider may test you for other STIs, including HIV (human immunodeficiency virus). How is this treated? This condition is treated with medicine taken by mouth (orally), such as metronidazole or tinidazole, to fight the infection. Your sexual partner(s) also need to be tested and treated.  If you are a woman and you plan to become pregnant or think you may be pregnant, tell your health care provider right away. Some medicines that are used to treat the infection should not be taken during pregnancy. Your health care provider may recommend over-the-counter medicines or creams to help relieve itching or irritation. You may be tested for infection again 3 months after treatment. Follow these instructions at home:  Take and use over-the-counter and prescription medicines, including creams, only as told by your health care provider.  Take your antibiotic medicine as told by your health care provider. Do not stop taking the antibiotic even if you start to feel better.  Do not have sex until 7-10 days after you finish your medicine, or until your health care provider approves. Ask your health care provider when you may start to have sex again.  (Women) Do not douche or wear tampons while you have the infection.  Discuss your infection with your sexual partner(s). Make sure that your partner gets tested and treated, if necessary.  Keep all follow-up visits as told by your health care provider. This is important. How is this prevented?   Use condoms every time you  have sex. Using condoms correctly and consistently can help protect against STIs.  Avoid having multiple sexual partners.  Talk with your sexual partner about any symptoms that either of you may have, as well as any history of STIs.  Get tested for STIs and STDs (sexually transmitted diseases) before you have sex. Ask your partner to do the same.  Do not have sexual contact if you have symptoms of trichomoniasis or another STI. Contact a health care provider if:  You still have symptoms after you finish your medicine.  You develop pain in your abdomen.  You have pain when you urinate.  You have bleeding after sex.  You develop a rash.  You feel nauseous or you vomit.  You plan to become pregnant or think you may be pregnant. Summary  Trichomoniasis is an STI (sexually transmitted infection) that can affect both women and men.  This condition often has no symptoms (is asymptomatic), especially in men.  Without treatment, this condition can last for months or years.  You should not have sex until 7-10 days after  you finish your medicine, or until your health care provider approves. Ask your health care provider when you may start to have sex again.  Discuss your infection with your sexual partner(s). Make sure that your partner gets tested and treated, if necessary. This information is not intended to replace advice given to you by your health care provider. Make sure you discuss any questions you have with your health care provider. Document Released: 02/16/2001 Document Revised: 06/06/2018 Document Reviewed: 06/06/2018 Elsevier Patient Education  2020 [employer]. Preventing Illegal Drug Use During Pregnancy While you are pregnant, everything that you take into your body affects you and your baby. Illegal drug use during pregnancy is dangerous for your health and your baby's health. Illegal or street drugs are never safe to use during pregnancy. This includes drugs such as:   Cocaine.  MDMA, also called ecstasy.  Amphetamines and methamphetamines.  Heroin.  Marijuana. This may be legal in some states but should not be used during pregnancy. Abusing prescription medicines, such as pain killers, is also a form of illegal drug use. This means taking medicines differently than how they were prescribed, such as in higher doses or for different reasons. This kind of illegal drug use can also be harmful to you and your baby. If you have questions or concerns about your drug or prescription medicine use, work with your health care provider. How can using illegal drugs during pregnancy affect me and my baby? Using illegal drugs or abusing prescription medicines during pregnancy raises your risk for:  Sudden loss of your baby.  Problems with the organ in your womb (uterus) that provides your baby with nutrients, blood, and oxygen (placenta).  Going into labor early (preterm labor), which can put your baby at risk for serious problems.  Using alcohol or other drugs during pregnancy.  Certain infections, including: ? HIV (human immunodeficiency virus). ? Hepatitis. ? STIs (sexually transmitted infections). Illegal drug use can also cause serious problems for your baby, such as:  Being born too early (prematurely).  Breathing problems, heart problems, or other birth defects.  Low birth weight.  An abnormally small head (microcephaly).  Lifelong developmental or learning disabilities. Illegal drug use can lead to dependence or addiction. Dependence or addiction can cause unpleasant and dangerous symptoms when you stop using the drug (withdrawal). The same can happen to your baby if you use drugs during pregnancy, and your baby may have symptoms of withdrawal after birth (neonatal abstinence syndrome). What are the benefits of avoiding illegal drug use during pregnancy? Not using illegal drugs or abusing prescription medicines is the best way to have a healthy  pregnancy and baby. This allows you to:  Be physically and mentally healthier.  Avoid possible legal problems related to drug use.  Give your baby a healthy start in life. What actions can I take?  Do not take any medicine during pregnancy unless it has been prescribed for you to take during your pregnancy.  If you think you may become pregnant, do not use illegal drugs. If you use illegal drugs and have trouble quitting, ask your health care provider for help.  If you are using drugs during pregnancy, get help as soon as possible. Treatment programs during pregnancy are focused on helping you: ? Safely stop illegal drug use. ? Get healthy and stay healthy. ? Have a healthy pregnancy. ? Work with a Chief Technology Officer. Where to find more information You can find more information about preventing illegal drug use during  pregnancy from:  March of Dimes: www.marchofdimes.org  American Pregnancy Association: americanpregnancy.org  [employer] College of Obstetricians and Gynecologists: www.acog.org Contact a health care provider if:   You become pregnant or think you may become pregnant while you are: ? Taking a prescription medicine. ? Using an illegal drug.  You are unable to stop using a drug.  You are pregnant and having withdrawal symptoms. Summary  Illegal drug use during pregnancy can cause serious health problems for you and your baby.  Abusing prescription medicines is a form of illegal drug use that can be just as dangerous as using street drugs.  Tell your health care provider right away if you get pregnant while taking any drug, including prescription medicines. This information is not intended to replace advice given to you by your health care provider. Make sure you discuss any questions you have with your health care provider. Document Released: 07/19/2017 Document Revised: 11/04/2017 Document Reviewed: 07/19/2017 Elsevier Patient Education  2020 [employer]

## 2019-07-10 ENCOUNTER — Encounter: Payer: Self-pay | Admitting: *Deleted

## 2019-07-12 ENCOUNTER — Ambulatory Visit (INDEPENDENT_AMBULATORY_CARE_PROVIDER_SITE_OTHER): Payer: Medicaid Other | Admitting: Clinical

## 2019-07-12 ENCOUNTER — Other Ambulatory Visit: Payer: Self-pay

## 2019-07-12 DIAGNOSIS — Z8659 Personal history of other mental and behavioral disorders: Secondary | ICD-10-CM

## 2019-07-12 DIAGNOSIS — F4310 Post-traumatic stress disorder, unspecified: Secondary | ICD-10-CM

## 2019-07-12 DIAGNOSIS — Z87898 Personal history of other specified conditions: Secondary | ICD-10-CM | POA: Diagnosis not present

## 2019-07-12 NOTE — BH Specialist Note (Signed)
Integrated Behavioral Health via Telemedicine Video Visit  07/12/2019 Deborah OSLAND [id number]  Number of [place] visits: 1 Session Start time: 10:26  Session End time: 11:09 Total time: 43 minutes  Referring Provider: Jorje Guild, NP Type of Visit: Video Patient/Family location: Home [facility] Provider location: [facility] All persons participating in visit: Patient Deborah Pennington and [place]  Confirmed patient's address: Yes  Confirmed patient's phone number: Yes  Any changes to demographics: No   Confirmed patient's insurance: Yes  Any changes to patient's insurance: No   Discussed confidentiality: Yes   I connected with Deborah Pennington by a video enabled telemedicine application and verified that I am speaking with the correct person using two identifiers.     I discussed the limitations of evaluation and management by telemedicine and the availability of in person appointments.  I discussed that the purpose of this visit is to provide behavioral health care while limiting exposure to the novel coronavirus.   Discussed there is a possibility of technology failure and discussed alternative modes of communication if that failure occurs.  I discussed that engaging in this video visit, they consent to the provision of behavioral healthcare and the services will be billed under their insurance.  Patient and/or legal guardian expressed understanding and consented to video visit: Yes   PRESENTING CONCERNS: Patient and/or family reports the following symptoms/concerns: Pt states her primary concern today is a history of depression, anxiety, PTSD, and substance use; has a counselor she has seen since 23yo until a few months ago; pt is open to finding out substance treatment options for postpartum.  Duration of problem: Ongoing, with increase in worry in pregnancy; Severity of problem: moderate  STRENGTHS (Protective Factors/Coping Skills): Social and  professional support; open to treatment  GOALS ADDRESSED: Patient will: 1.  Reduce symptoms of: anxiety and depression  2.  Increase knowledge and/or ability of: healthy habits  3.  Demonstrate ability to: Increase healthy adjustment to current life circumstances  INTERVENTIONS: Interventions utilized:  Psychoeducation and/or Health Education and Link to [employer] Standardized Assessments completed: GAD-7 and PHQ 9  ASSESSMENT: Patient currently experiencing History of substance use disorder, PTSD  Patient may benefit from psychoeducation and brief therapeutic interventions regarding preventing relapse of substance use, as well as symptoms of anxiety and depression. Marland Kitchen  PLAN: 1. Follow up with behavioral health clinician on : Two weeks 2. Behavioral recommendations:  -Pick up Postpartum Planner, etc. At check-in for ultrasound tomorrow.  -Consider substance treatment options listed for postpartum; will discuss further at next visit 3. Referral(s): [place] (In Clinic)  I discussed the assessment and treatment plan with the patient and/or parent/guardian. They were provided an opportunity to ask questions and all were answered. They agreed with the plan and demonstrated an understanding of the instructions.   They were advised to call back or seek an in-person evaluation if the symptoms worsen or if the condition fails to improve as anticipated.  Caroleen Hamman Deborah Pennington  Depression screen [facility] 2/9 07/12/2019  Decreased Interest 3  Down, Depressed, Hopeless 1  PHQ - 2 Score 4  Altered sleeping 3  Tired, decreased energy 1  Change in appetite 0  Feeling bad or failure about yourself  0  Trouble concentrating 3  Moving slowly or fidgety/restless 0  Suicidal thoughts 0  PHQ-9 Score 11   GAD 7 : Generalized Anxiety Score 07/12/2019  Nervous, Anxious, on Edge 2  Control/stop worrying 3  Worry too much -  different things 0  Trouble relaxing 0   Restless 0  Easily annoyed or irritable 3  Afraid - awful might happen 3  Total GAD 7 Score 11

## 2019-07-13 ENCOUNTER — Other Ambulatory Visit: Payer: Self-pay | Admitting: Certified Nurse Midwife

## 2019-07-13 ENCOUNTER — Other Ambulatory Visit: Payer: Self-pay

## 2019-07-13 ENCOUNTER — Ambulatory Visit (HOSPITAL_COMMUNITY)
Admission: RE | Admit: 2019-07-13 | Discharge: 2019-07-13 | Disposition: A | Discharge status: Home / Self Care | Payer: Medicaid Other | Source: Ambulatory Visit | Attending: Obstetrics and Gynecology | Admitting: Obstetrics and Gynecology

## 2019-07-13 DIAGNOSIS — Z3480 Encounter for supervision of other normal pregnancy, unspecified trimester: Secondary | ICD-10-CM

## 2019-07-13 DIAGNOSIS — O99322 Drug use complicating pregnancy, second trimester: Secondary | ICD-10-CM

## 2019-07-13 DIAGNOSIS — Z3A25 25 weeks gestation of pregnancy: Secondary | ICD-10-CM | POA: Diagnosis not present

## 2019-07-26 ENCOUNTER — Other Ambulatory Visit: Payer: Self-pay | Admitting: *Deleted

## 2019-07-26 DIAGNOSIS — Z3480 Encounter for supervision of other normal pregnancy, unspecified trimester: Secondary | ICD-10-CM

## 2019-07-27 NOTE — BH Specialist Note (Deleted)
Integrated Behavioral Health Follow Up Visit  MRN: [id number] Name: Deborah Pennington  Number of [place] Clinician visits: 2/6 Session Start time: 10:15***  Session End time: 10:45*** Total time: {IBH Total Time:21014050}  Type of Service: [place] Interpretor:No. Interpretor Name and Language: n/a  SUBJECTIVE: Deborah Pennington is a 23 y.o. female accompanied by n/a Patient was referred by Darron Doom, MD for ***. Patient reports the following symptoms/concerns: *** Duration of problem: ***; Severity of problem: {Mild/Moderate/Severe:20260}  OBJECTIVE: Mood: {BHH MOOD:22306} and Affect: {BHH AFFECT:22307} Risk of harm to self or others: {CHL AMB BH Suicide Current Mental Status:21022748}  LIFE CONTEXT: Family and Social: *** School/Work: *** Self-Care: *** Life Changes: Current pregnancy ***  GOALS ADDRESSED: Patient will: 1.  Reduce symptoms of: {IBH Symptoms:21014056}  2.  Increase knowledge and/or ability of: {IBH Patient Tools:21014057}  3.  Demonstrate ability to: {IBH Goals:21014053}  INTERVENTIONS: Interventions utilized:  {IBH Interventions:21014054} Standardized Assessments completed: {IBH Screening Tools:21014051}  ASSESSMENT: Patient currently experiencing ***.   Patient may benefit from psychoeducation and brief therapeutic interventions regarding coping with symptoms of *** ***.  PLAN: 1. Follow up with behavioral health clinician on : *** 2. Behavioral recommendations:  -*** -***(substance options for pp/ pp planner given) 3. Referral(s): {IBH Referrals:21014055} 4. "From scale of 1-10, how likely are you to follow plan?": ***  Caroleen Hamman McMannes, LCSW

## 2019-07-30 ENCOUNTER — Telehealth: Payer: Self-pay | Admitting: Family Medicine

## 2019-07-30 ENCOUNTER — Encounter: Payer: Self-pay | Admitting: Family Medicine

## 2019-07-30 ENCOUNTER — Other Ambulatory Visit: Payer: Medicaid Other

## 2019-07-30 ENCOUNTER — Encounter: Payer: Medicaid Other | Admitting: Family Medicine

## 2019-07-30 ENCOUNTER — Ambulatory Visit: Payer: Medicaid Other

## 2019-07-30 NOTE — Progress Notes (Signed)
Patient did not keep appointment today. She will be called to reschedule.  

## 2019-07-30 NOTE — Telephone Encounter (Signed)
Attempted to contact patient to get her rescheduled for her missed OB appointment. No answer, left voicemail for patient to give the office a call back to be rescheduled. No show letter mailed.  °

## 2020-08-27 IMAGING — US OBSTETRIC <14 WK ULTRASOUND
1 series · 15 of 28 positions shown · non-contrast
Comparison: None.

CLINICAL DATA: First trimester pregnancy with inconclusive fetal
viability. Unsure of LMP

EXAM:
OBSTETRIC <14 WK ULTRASOUND
TECHNIQUE: Transabdominal ultrasound was performed for evaluation of the
gestation as well as the maternal uterus and adnexal regions.

[Series 1: obstetric <14 wk ultrasound · 50 acquisitions, 15 frames shown]
[im 1/50]
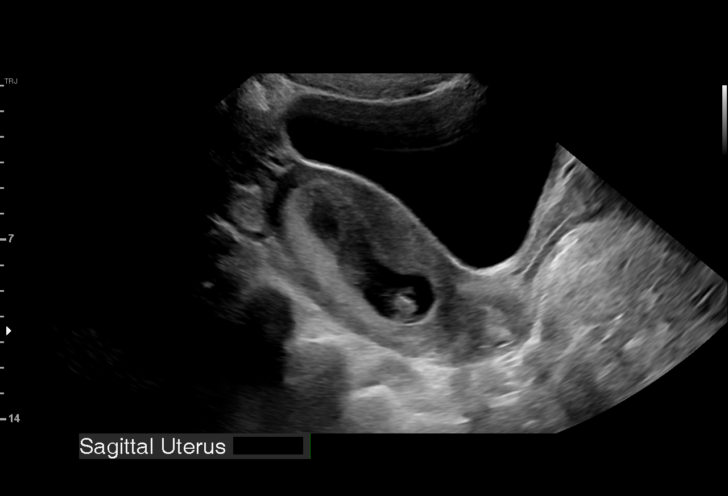
[im 4/50]
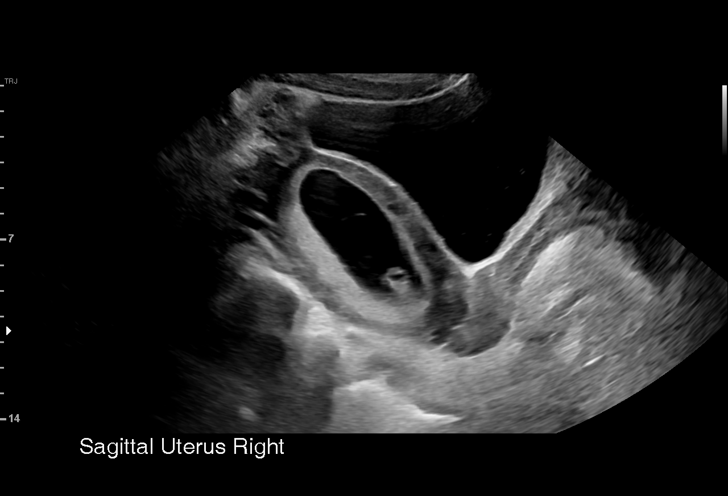
[im 8/50]
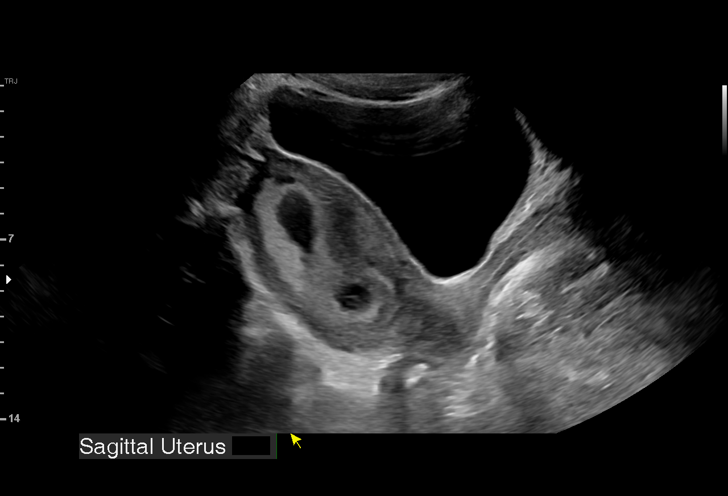
[im 11/50]
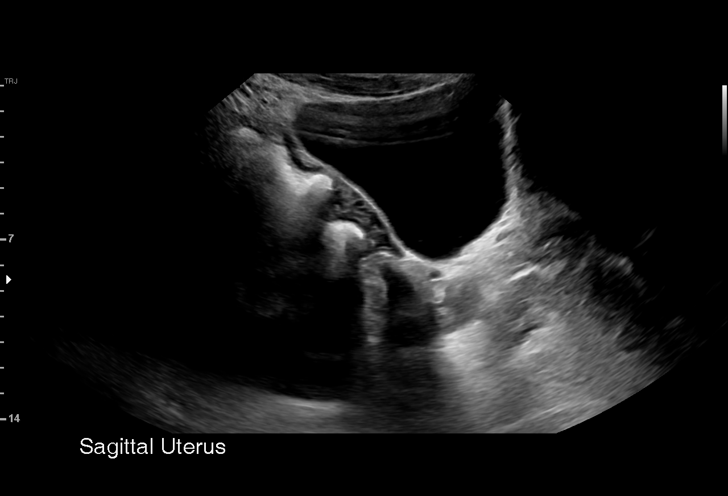
[im 15/50]
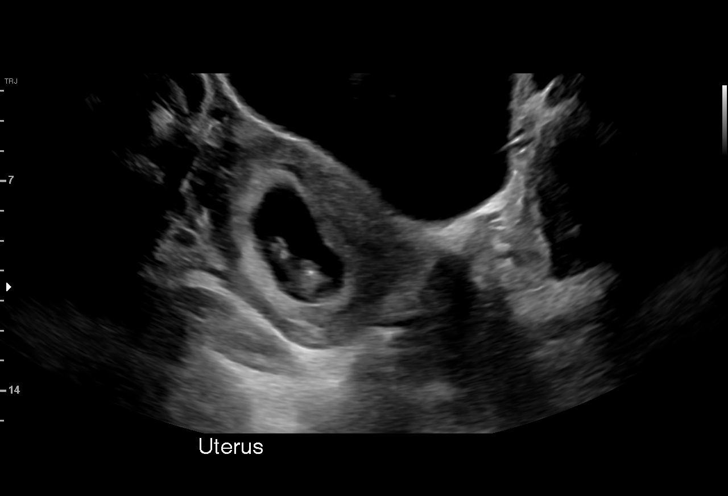
[im 19/50]
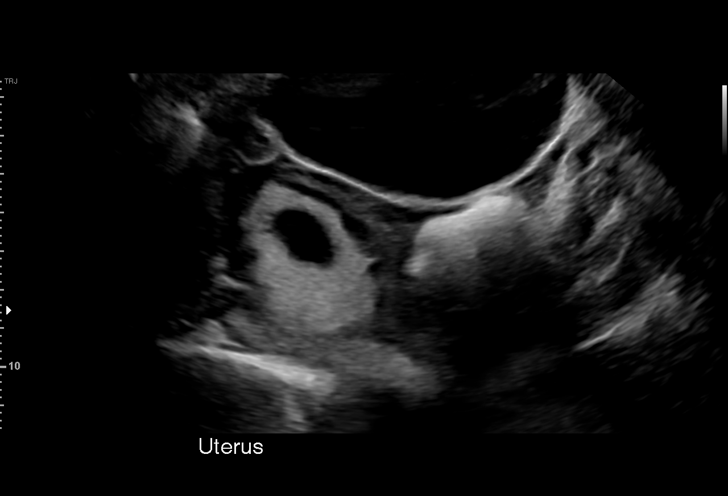
[im 22/50]
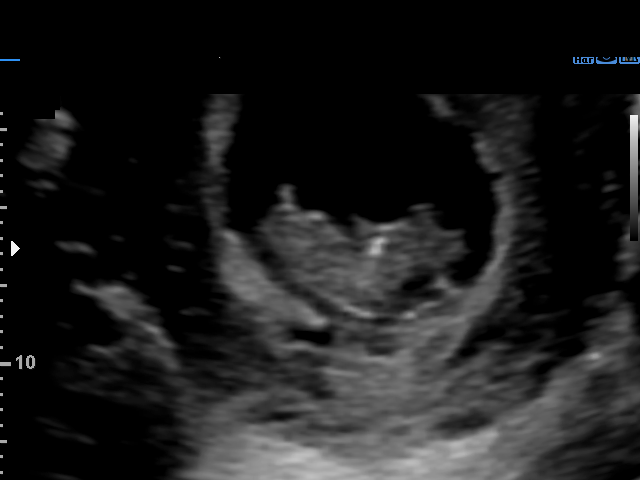
[im 26/50]
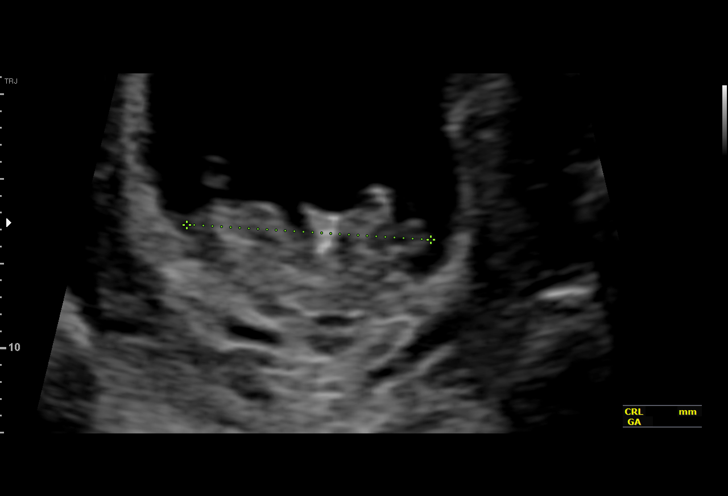
[im 28/50]
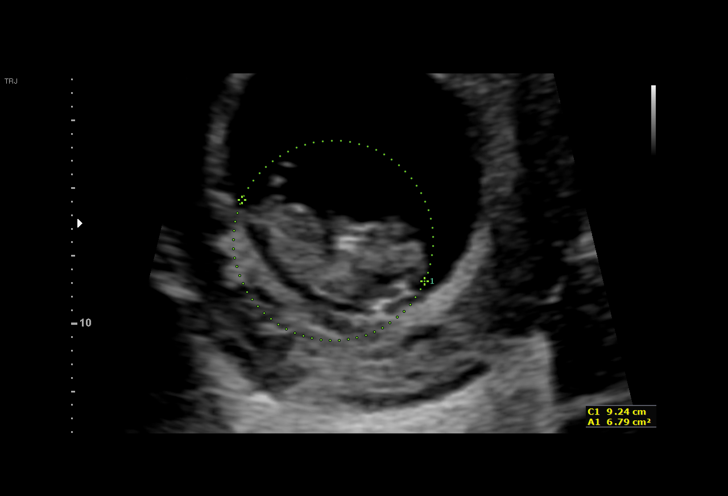
[im 31/50]
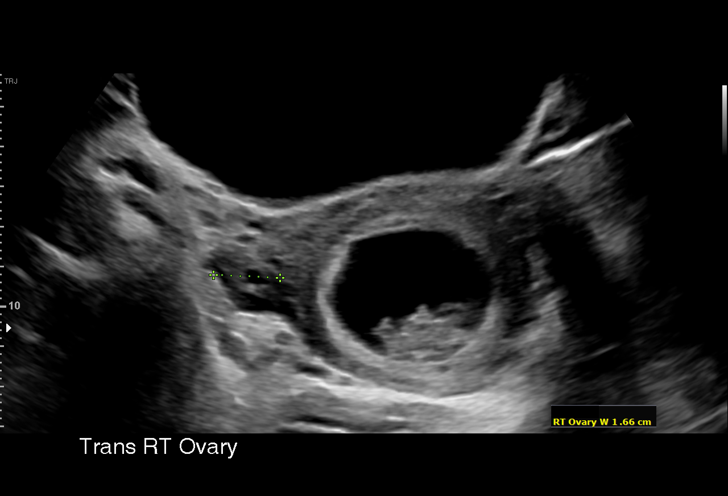
[im 35/50]
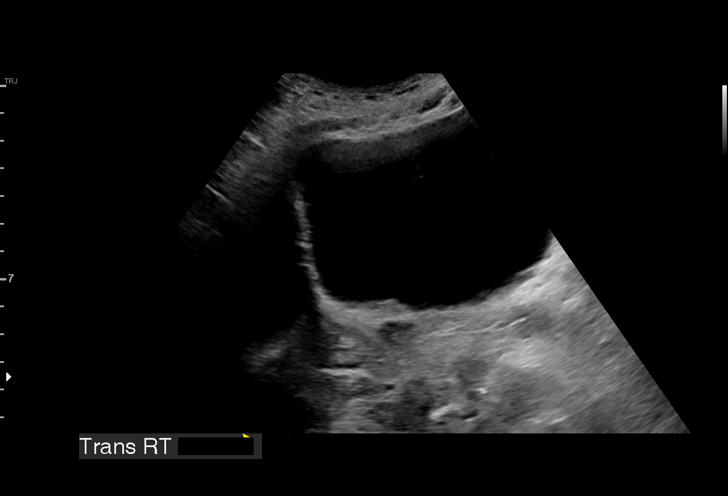
[im 39/50]
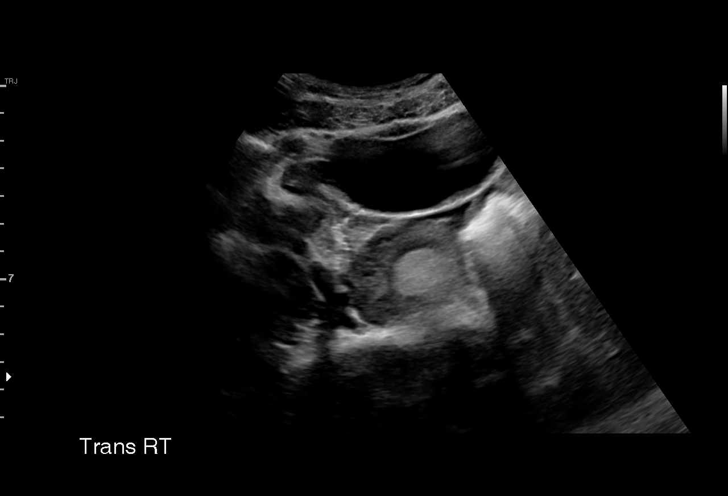
[im 42/50]
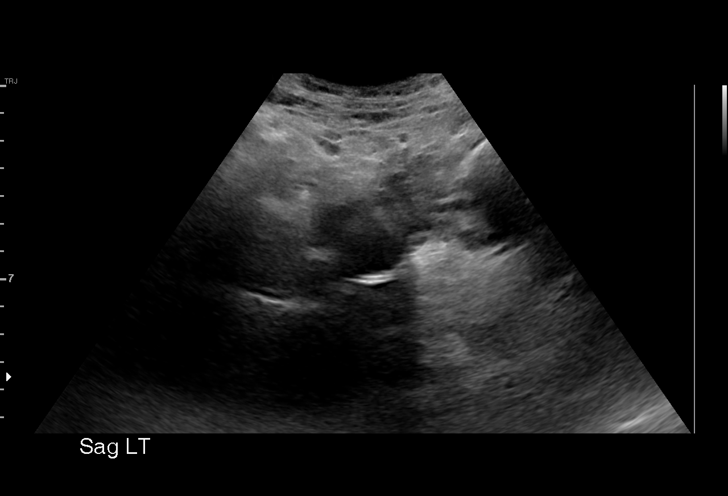
[im 46/50]
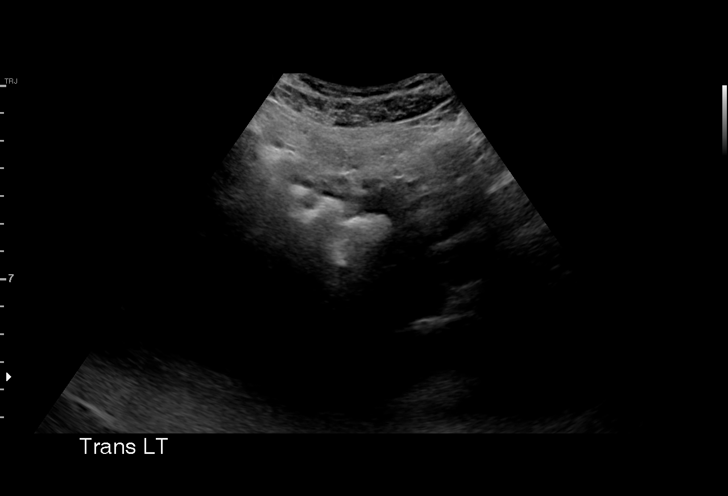
[im 50/50]
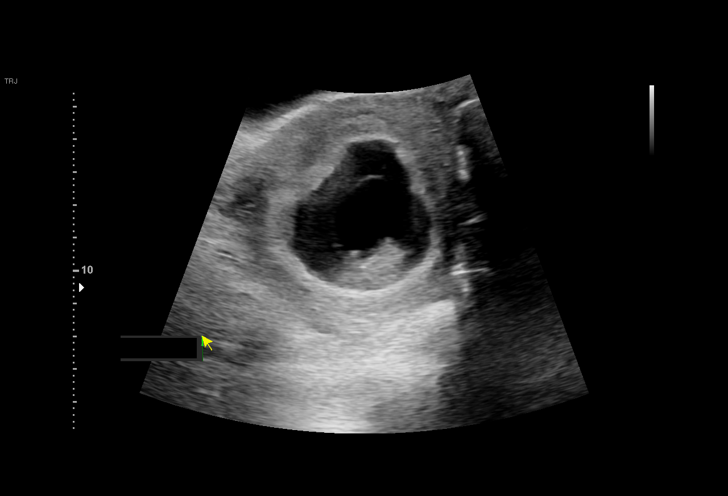

[15 of 28 positions shown; findings below may reference images not displayed]

FINDINGS: Intrauterine gestational sac: Single

Yolk sac:  Visualized.

Embryo:  Visualized.

Cardiac Activity: Visualized.

Heart Rate: 178 bpm

CRL:   28 mm   9 w 4 d                  US EDC: 10/25/2019

Subchorionic hemorrhage:  None visualized.

Maternal uterus/adnexae: Normal appearance of both ovaries. No mass
or abnormal free fluid identified.
IMPRESSION: Single living IUP measuring 9 weeks 4 days, with US EDC of
10/25/2019.

No significant maternal uterine or adnexal abnormality identified.

## 2022-06-25 ENCOUNTER — Encounter (HOSPITAL_COMMUNITY): Payer: Self-pay

## 2022-06-25 ENCOUNTER — Emergency Department (HOSPITAL_COMMUNITY)
Admission: EM | Admit: 2022-06-25 | Discharge: 2022-06-27 | Disposition: A | Discharge status: Home / Self Care | Payer: Medicaid Other | Attending: Emergency Medicine | Admitting: Emergency Medicine

## 2022-06-25 DIAGNOSIS — R4182 Altered mental status, unspecified: Secondary | ICD-10-CM | POA: Diagnosis present

## 2022-06-25 DIAGNOSIS — F1514 Other stimulant abuse with stimulant-induced mood disorder: Secondary | ICD-10-CM | POA: Diagnosis not present

## 2022-06-25 DIAGNOSIS — Z20822 Contact with and (suspected) exposure to covid-19: Secondary | ICD-10-CM | POA: Diagnosis not present

## 2022-06-25 DIAGNOSIS — F1594 Other stimulant use, unspecified with stimulant-induced mood disorder: Secondary | ICD-10-CM | POA: Diagnosis present

## 2022-06-25 DIAGNOSIS — R45851 Suicidal ideations: Secondary | ICD-10-CM | POA: Diagnosis not present

## 2022-06-25 LAB — CBC WITH DIFFERENTIAL/PLATELET
Abs Immature Granulocytes: 0.02 10*3/uL (ref 0.00–0.07)
Basophils Absolute: 0 10*3/uL (ref 0.0–0.1)
Basophils Relative: 0 %
Eosinophils Absolute: 0.2 10*3/uL (ref 0.0–0.5)
Eosinophils Relative: 2 %
HCT: 43.2 % (ref 36.0–46.0)
Hemoglobin: 13.7 g/dL (ref 12.0–15.0)
Immature Granulocytes: 0 %
Lymphocytes Relative: 31 %
Lymphs Abs: 3 10*3/uL (ref 0.7–4.0)
MCH: 29.3 pg (ref 26.0–34.0)
MCHC: 31.7 g/dL (ref 30.0–36.0)
MCV: 92.3 fL (ref 80.0–100.0)
Monocytes Absolute: 0.6 10*3/uL (ref 0.1–1.0)
Monocytes Relative: 6 %
Neutro Abs: 5.7 10*3/uL (ref 1.7–7.7)
Neutrophils Relative %: 61 %
Platelets: 290 10*3/uL (ref 150–400)
RBC: 4.68 MIL/uL (ref 3.87–5.11)
RDW: 13.2 % (ref 11.5–15.5)
WBC: 9.5 10*3/uL (ref 4.0–10.5)
nRBC: 0 % (ref 0.0–0.2)

## 2022-06-25 LAB — COMPREHENSIVE METABOLIC PANEL
ALT: 14 U/L (ref 0–44)
AST: 26 U/L (ref 15–41)
Albumin: 4.4 g/dL (ref 3.5–5.0)
Alkaline Phosphatase: 64 U/L (ref 38–126)
Anion gap: 12 (ref 5–15)
BUN: 11 mg/dL (ref 6–20)
CO2: 18 mmol/L — ABNORMAL LOW (ref 22–32)
Calcium: 8.9 mg/dL (ref 8.9–10.3)
Chloride: 110 mmol/L (ref 98–111)
Creatinine, Ser: 0.85 mg/dL (ref 0.44–1.00)
GFR, Estimated: >60 mL/min (ref >60)
Glucose, Bld: 99 mg/dL (ref 70–99)
Potassium: 3.6 mmol/L (ref 3.5–5.1)
Sodium: 140 mmol/L (ref 135–145)
Total Bilirubin: 0.4 mg/dL (ref 0.3–1.2)
Total Protein: 7.9 g/dL (ref 6.5–8.1)

## 2022-06-25 LAB — HCG, QUANTITATIVE, PREGNANCY: hCG, Beta Chain, Quant, S: <1 m[IU]/mL (ref <5)

## 2022-06-25 LAB — ETHANOL: Alcohol, Ethyl (B): <10 mg/dL (ref <10)

## 2022-06-25 LAB — RAPID URINE DRUG SCREEN, HOSP PERFORMED
Amphetamines: POSITIVE — AB
Barbiturates: NOT DETECTED
Benzodiazepines: NOT DETECTED
Cocaine: NOT DETECTED
Opiates: NOT DETECTED
Tetrahydrocannabinol: NOT DETECTED

## 2022-06-25 MED ORDER — LORAZEPAM 2 MG/ML IJ SOLN
2.0000 mg | Freq: Once | INTRAMUSCULAR | Status: AC
  Administered 2022-06-25: 2 mg via INTRAMUSCULAR
  Filled 2022-06-25: qty 1

## 2022-06-25 MED ORDER — ZIPRASIDONE MESYLATE 20 MG IM SOLR: 10.0000 mg | Freq: Once | INTRAMUSCULAR | Status: AC

## 2022-06-25 MED ORDER — HALOPERIDOL LACTATE 5 MG/ML IJ SOLN
5.0000 mg | Freq: Once | INTRAMUSCULAR | Status: AC
  Administered 2022-06-25: 5 mg via INTRAMUSCULAR
  Filled 2022-06-25: qty 1

## 2022-06-25 MED ORDER — ZIPRASIDONE MESYLATE 20 MG IM SOLR
INTRAMUSCULAR | Status: AC
  Administered 2022-06-25: 10 mg via INTRAMUSCULAR
  Filled 2022-06-25: qty 20

## 2022-06-25 MED ORDER — STERILE WATER FOR INJECTION IJ SOLN
INTRAMUSCULAR | Status: AC
  Administered 2022-06-25: 2.1 mL
  Filled 2022-06-25: qty 10

## 2022-06-25 NOTE — Progress Notes (Addendum)
Attempted to arouse patient for assessment. Patient briefly woke up, cursed at provider, and went back to sleep. Received geodon 10 mg IM at 1217 and received ativan and haldol at 1233. Plan to evaluate later.

## 2022-06-25 NOTE — ED Triage Notes (Signed)
Pt arrived via EMS, IVC by family due to mania, and aggressive behavior. Threatening "revenge on who took my kids" Endorses SI to family. Went to brothers home and slashes tires. No cooperative with PD or staff. Pt combative.  Medicated 10 geodon IM on arrival per MD [facility]

## 2022-06-25 NOTE — ED Provider Notes (Signed)
Patient is medically cleared and awaiting behavioral health assessment   Milton Ferguson, MD 06/28/22 1047

## 2022-06-25 NOTE — ED Notes (Addendum)
Attempted to do COVID swab. Pt didn't tolerate it well. Will attempt later

## 2022-06-25 NOTE — ED Notes (Signed)
Pt placed in violent restraints. Pt given IM geodon. Pt screaming and yelling at staff. Pt out of restraints. Pt placed back in restraints.

## 2022-06-25 NOTE — ED Notes (Signed)
Pt in and out cathed by this RN. Denton Ar, RN assisting w/ cath.

## 2022-06-25 NOTE — ED Provider Notes (Incomplete)
[place] DEPT Provider Note   CSN: [id number] Arrival date & time: 06/25/22  1155     History {Add pertinent medical, surgical, social history, OB history to HPI:1} No chief complaint on file.   Deborah Pennington is a 26 y.o. female.  Patient was IVC by family.  Patient was upset because child protective services took her 52-year-old from her and gave the child for care to her brother.  The judge told her not to go over to the brother's house but she went over there and slashed his tires.  Patient also has been very combative and has stated she wanted to kill herself.  When patient arrived she was yelling and screaming  The history is provided by the police. No language interpreter was used.  Altered Mental Status Presenting symptoms: behavior changes   Severity:  Severe Most recent episode:  Today Episode history:  Continuous Timing:  Constant Progression:  Worsening Chronicity:  New Context: not alcohol use        Home Medications Prior to Admission medications   Medication Sig Start Date End Date Taking? Authorizing Provider  docusate sodium (COLACE) 100 MG capsule Take 1 capsule (100 mg total) by mouth every 12 (twelve) hours. Patient not taking: Reported on 06/11/2019 02/26/19   Starr Lake, CNM  ondansetron (ZOFRAN-ODT) 8 MG disintegrating tablet Take 1 tablet (8 mg total) by mouth every 8 (eight) hours as needed for nausea or vomiting. Patient not taking: Reported on 06/11/2019 02/26/19   Starr Lake, CNM  Prenatal MV & Min w/FA-DHA (PRENATAL ADULT GUMMY/DHA/FA) 0.4-25 MG CHEW Chew 1 tablet by mouth daily. 02/26/19   Starr Lake, CNM  terconazole (TERAZOL 7) 0.4 % vaginal cream Place 1 applicator vaginally at bedtime. Patient not taking: Reported on 07/09/2019 06/21/19   Lajean Manes, CNM      Allergies    Patient has no known allergies.    Review of Systems   Review of Systems  Unable  to perform ROS: Mental status change    Physical Exam Updated Vital Signs BP 118/84   Pulse 98   Resp 16   SpO2 100%  Physical Exam Vitals and nursing note reviewed.  Constitutional:      Appearance: She is well-developed. She is ill-appearing.     Comments: Patient screaming and yelling at police  HENT:     Head: Normocephalic.     Nose: Nose normal.  Eyes:     General: No scleral icterus.    Conjunctiva/sclera: Conjunctivae normal.  Neck:     Thyroid: No thyromegaly.  Cardiovascular:     Rate and Rhythm: Normal rate and regular rhythm.     Heart sounds: No murmur heard.    No friction rub. No gallop.  Pulmonary:     Breath sounds: No stridor. No wheezing or rales.  Chest:     Chest wall: No tenderness.  Abdominal:     General: There is no distension.     Tenderness: There is no abdominal tenderness. There is no rebound.  Musculoskeletal:        General: Normal range of motion.     Cervical back: Neck supple.  Lymphadenopathy:     Cervical: No cervical adenopathy.  Skin:    Findings: No erythema or rash.  Neurological:     General: No focal deficit present.     Mental Status: She is alert.     Motor: No abnormal muscle tone.  Coordination: Coordination normal.  Psychiatric:     Comments: Patient very belligerent.  She is told family to want to kill himself.     ED Results / Procedures / Treatments   Labs (all labs ordered are listed, but only abnormal results are displayed) Labs Reviewed  COMPREHENSIVE METABOLIC PANEL - Abnormal; Notable for the following components:      Result Value   CO2 18 (*)    All other components within normal limits  RAPID URINE DRUG SCREEN, HOSP PERFORMED - Abnormal; Notable for the following components:   Amphetamines POSITIVE (*)    All other components within normal limits  CBC WITH DIFFERENTIAL/PLATELET  ETHANOL  HCG, QUANTITATIVE, PREGNANCY    EKG None  Radiology No results found.  Procedures Procedures   {Document cardiac monitor, telemetry assessment procedure when appropriate:1}  Medications Ordered in ED Medications  ziprasidone (GEODON) injection 10 mg (10 mg Intramuscular Given 06/25/22 1217)  sterile water (preservative free) injection (2.1 mLs  Given 06/25/22 1217)  LORazepam (ATIVAN) injection 2 mg (2 mg Intramuscular Given 06/25/22 1233)  haloperidol lactate (HALDOL) injection 5 mg (5 mg Intramuscular Given 06/25/22 1233)    ED Course/ Medical Decision Making/ A&P  Patient is medically cleared                         Medical Decision Making Amount and/or Complexity of Data Reviewed Labs: ordered.  Risk Prescription drug management.   Patient with suicidal ideations.  She will be evaluated by behavioral health  {Document critical care time when appropriate:1} {Document review of labs and clinical decision tools ie heart score, Chads2Vasc2 etc:1}  {Document your independent review of radiology images, and any outside records:1} {Document your discussion with family members, caretakers, and with consultants:1} {Document social determinants of health affecting pt's care:1} {Document your decision making why or why not admission, treatments were needed:1} Final Clinical Impression(s) / ED Diagnoses Final diagnoses:  None    Rx / DC Orders ED Discharge Orders     None

## 2022-06-26 DIAGNOSIS — F1594 Other stimulant use, unspecified with stimulant-induced mood disorder: Secondary | ICD-10-CM | POA: Diagnosis present

## 2022-06-26 LAB — RESP PANEL BY RT-PCR (FLU A&B, COVID) ARPGX2
Influenza A by PCR: NEGATIVE
Influenza B by PCR: NEGATIVE
SARS Coronavirus 2 by RT PCR: NEGATIVE

## 2022-06-26 MED ORDER — OLANZAPINE 5 MG PO TBDP: 5.0000 mg | ORAL_TABLET | Freq: Every day | ORAL | Status: DC

## 2022-06-26 NOTE — ED Notes (Signed)
Pt wouldn't allow staff to collect respiratory panel

## 2022-06-26 NOTE — ED Notes (Addendum)
Pt refused COVID swab and vitals. Pt stated "Bitch get out of my fucking face"

## 2022-06-26 NOTE — BH Assessment (Signed)
Pt is currently somnolent and unable to participate in tele-assessment. TTS will assess Pt when she is awake.   Evelena Peat, [facility], [facility] Triage Specialist [phone]

## 2022-06-26 NOTE — ED Notes (Signed)
Pt refuses all care from staff at this time and throughout the night. Pt will not allow anyone to take vitals or cooperate with care.

## 2022-06-26 NOTE — Progress Notes (Signed)
Inpatient Behavioral Health Placement  Pt meets inpatient criteria per Lindon Romp, NP.  Referral was sent to the following facilities;    Destination Service Provider Address Phone Fax  [facility]  [address] [place] [address] [phone] [place]  [place], [place] [address] [phone]  [facility]  [address] [place] [address] [place]  [facility]  [address] [place] [address] [phone]  [place] [address], [place] [address] [phone]  [facility]  [place] [address] [phone]  [facility]  [address], [place] [address] [phone]  [facility]  [address] [place] [address] [phone]   Situation ongoing,  CSW will follow up.   Benjaman Kindler, MSW, [facility] 06/26/2022  @ 12:43 PM
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
## 2022-06-26 NOTE — ED Provider Notes (Signed)
Patient is medically cleared.  She is recommended for inpatient treatment.   Lennice Sites, DO 06/26/22 1126

## 2022-06-27 ENCOUNTER — Other Ambulatory Visit: Payer: Self-pay

## 2022-06-27 DIAGNOSIS — F1594 Other stimulant use, unspecified with stimulant-induced mood disorder: Secondary | ICD-10-CM | POA: Diagnosis not present

## 2022-06-27 NOTE — Discharge Instructions (Signed)
Follow-up as instructed by behavioral health 
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
## 2022-12-06 DEATH — deceased
